# Patient Record
Sex: Male | Born: 1980 | Race: White | Hispanic: No | Marital: Married | Smoking: Former smoker
Health system: Southern US, Community
[De-identification: ages and names within clinical notes are randomized; demographics above are authoritative.]

## PROBLEM LIST (undated history)

## (undated) DIAGNOSIS — E785 Hyperlipidemia, unspecified: Secondary | ICD-10-CM

## (undated) HISTORY — PX: NASAL SEPTUM SURGERY: SHX37

## (undated) HISTORY — DX: Hyperlipidemia, unspecified: E78.5

---

## 2009-12-18 ENCOUNTER — Emergency Department (HOSPITAL_COMMUNITY): Admission: EM | Admit: 2009-12-18 | Discharge: 2009-12-18 | Payer: Self-pay | Admitting: Family Medicine

## 2010-04-02 ENCOUNTER — Emergency Department (HOSPITAL_COMMUNITY)
Admission: EM | Admit: 2010-04-02 | Discharge: 2010-04-02 | Payer: Self-pay | Source: Home / Self Care | Admitting: Family Medicine

## 2010-05-11 ENCOUNTER — Ambulatory Visit
Admission: RE | Admit: 2010-05-11 | Discharge: 2010-05-11 | Disposition: A | Discharge status: Home / Self Care | Payer: PRIVATE HEALTH INSURANCE | Source: Ambulatory Visit | Attending: Orthopedic Surgery | Admitting: Orthopedic Surgery

## 2010-05-11 ENCOUNTER — Other Ambulatory Visit: Payer: Self-pay | Admitting: Orthopedic Surgery

## 2010-05-11 DIAGNOSIS — M5430 Sciatica, unspecified side: Secondary | ICD-10-CM

## 2010-05-25 ENCOUNTER — Other Ambulatory Visit: Payer: Self-pay | Admitting: Orthopedic Surgery

## 2010-05-25 DIAGNOSIS — M545 Low back pain, unspecified: Secondary | ICD-10-CM

## 2010-06-01 ENCOUNTER — Ambulatory Visit
Admission: RE | Admit: 2010-06-01 | Discharge: 2010-06-01 | Disposition: A | Discharge status: Home / Self Care | Payer: PRIVATE HEALTH INSURANCE | Source: Ambulatory Visit | Attending: Orthopedic Surgery | Admitting: Orthopedic Surgery

## 2010-06-01 DIAGNOSIS — M545 Low back pain, unspecified: Secondary | ICD-10-CM

## 2010-06-11 LAB — GLUCOSE, CAPILLARY

## 2010-06-25 ENCOUNTER — Ambulatory Visit: Payer: PRIVATE HEALTH INSURANCE | Attending: Orthopedic Surgery | Admitting: Physical Therapy

## 2010-06-25 DIAGNOSIS — M25569 Pain in unspecified knee: Secondary | ICD-10-CM | POA: Insufficient documentation

## 2010-06-25 DIAGNOSIS — M2569 Stiffness of other specified joint, not elsewhere classified: Secondary | ICD-10-CM | POA: Insufficient documentation

## 2010-06-25 DIAGNOSIS — M545 Low back pain, unspecified: Secondary | ICD-10-CM | POA: Insufficient documentation

## 2010-06-25 DIAGNOSIS — IMO0001 Reserved for inherently not codable concepts without codable children: Secondary | ICD-10-CM | POA: Insufficient documentation

## 2010-07-04 ENCOUNTER — Ambulatory Visit: Payer: PRIVATE HEALTH INSURANCE | Attending: Orthopedic Surgery | Admitting: Physical Therapy

## 2010-07-04 DIAGNOSIS — M25569 Pain in unspecified knee: Secondary | ICD-10-CM | POA: Insufficient documentation

## 2010-07-04 DIAGNOSIS — IMO0001 Reserved for inherently not codable concepts without codable children: Secondary | ICD-10-CM | POA: Insufficient documentation

## 2010-07-04 DIAGNOSIS — M545 Low back pain, unspecified: Secondary | ICD-10-CM | POA: Insufficient documentation

## 2010-07-04 DIAGNOSIS — M2569 Stiffness of other specified joint, not elsewhere classified: Secondary | ICD-10-CM | POA: Insufficient documentation

## 2010-07-05 ENCOUNTER — Ambulatory Visit: Payer: PRIVATE HEALTH INSURANCE | Admitting: Physical Therapy

## 2010-07-11 ENCOUNTER — Encounter: Payer: PRIVATE HEALTH INSURANCE | Admitting: Physical Therapy

## 2010-07-18 ENCOUNTER — Ambulatory Visit: Payer: PRIVATE HEALTH INSURANCE | Admitting: Physical Therapy

## 2010-07-20 ENCOUNTER — Encounter: Payer: PRIVATE HEALTH INSURANCE | Admitting: Physical Therapy

## 2012-07-28 IMAGING — CR DG LUMBAR SPINE COMPLETE 4+V
5 series · 5 of 5 positions shown · non-contrast
Comparison: None.

CLINICAL DATA: 2 months persistent low back and left leg pain
without recent injury.

LUMBAR SPINE - COMPLETE 4+ VIEW

[t l-spine a.p.]
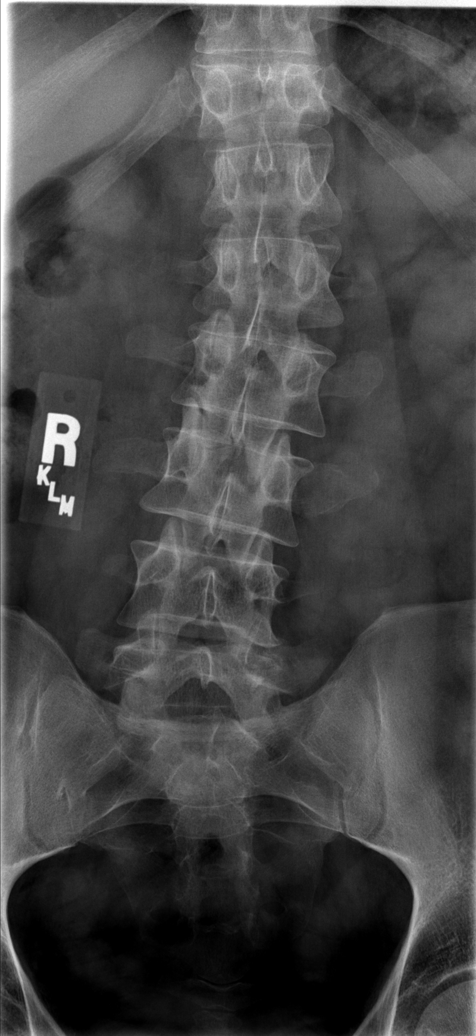

[t l-spine oblique exposure (1 of 2)]
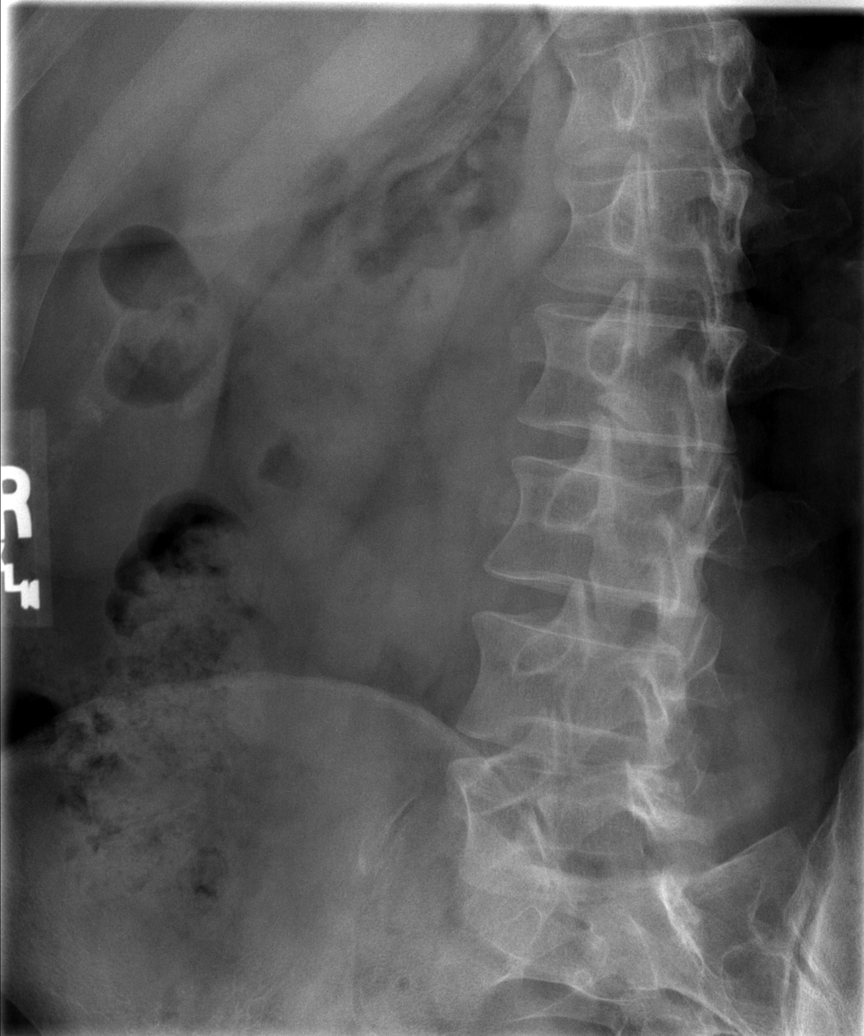

[t l-spine oblique exposure (2 of 2)]
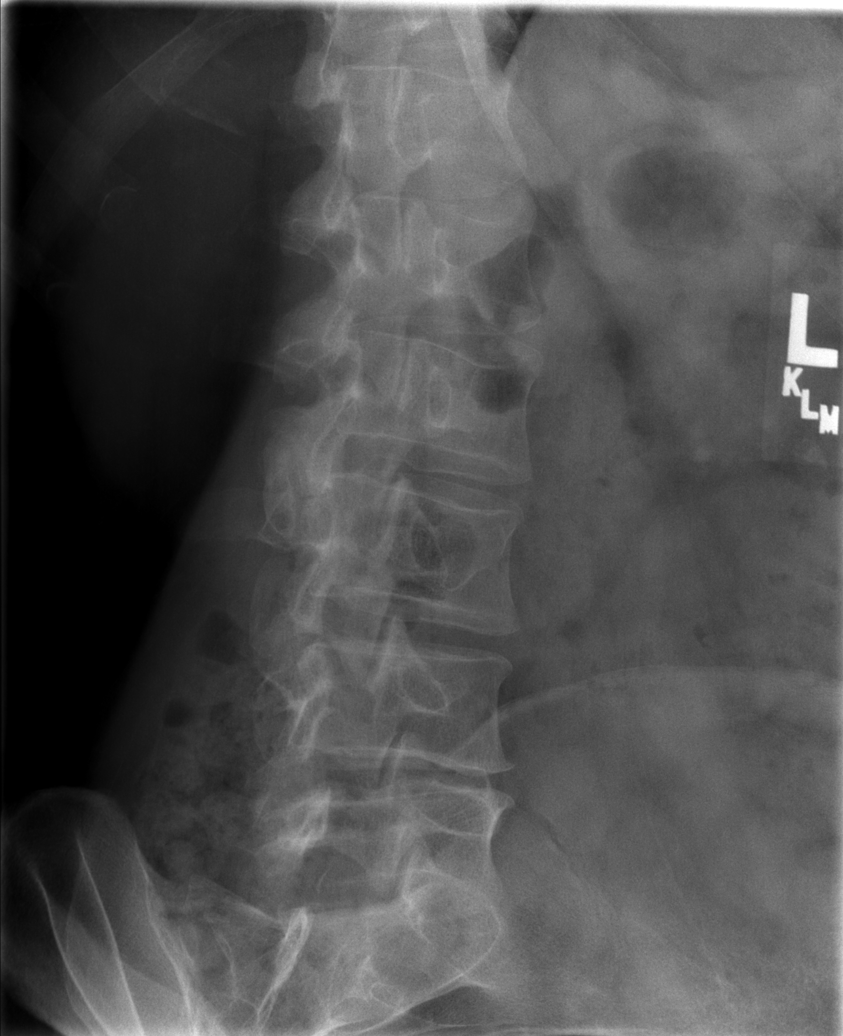

[t l-spine lat]
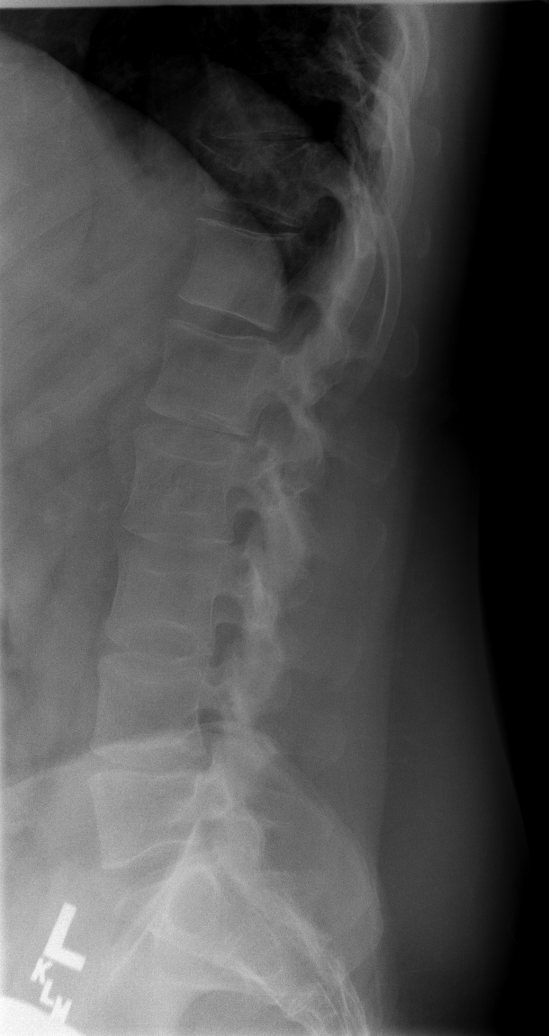

[t l-spine l5-s1 spot]
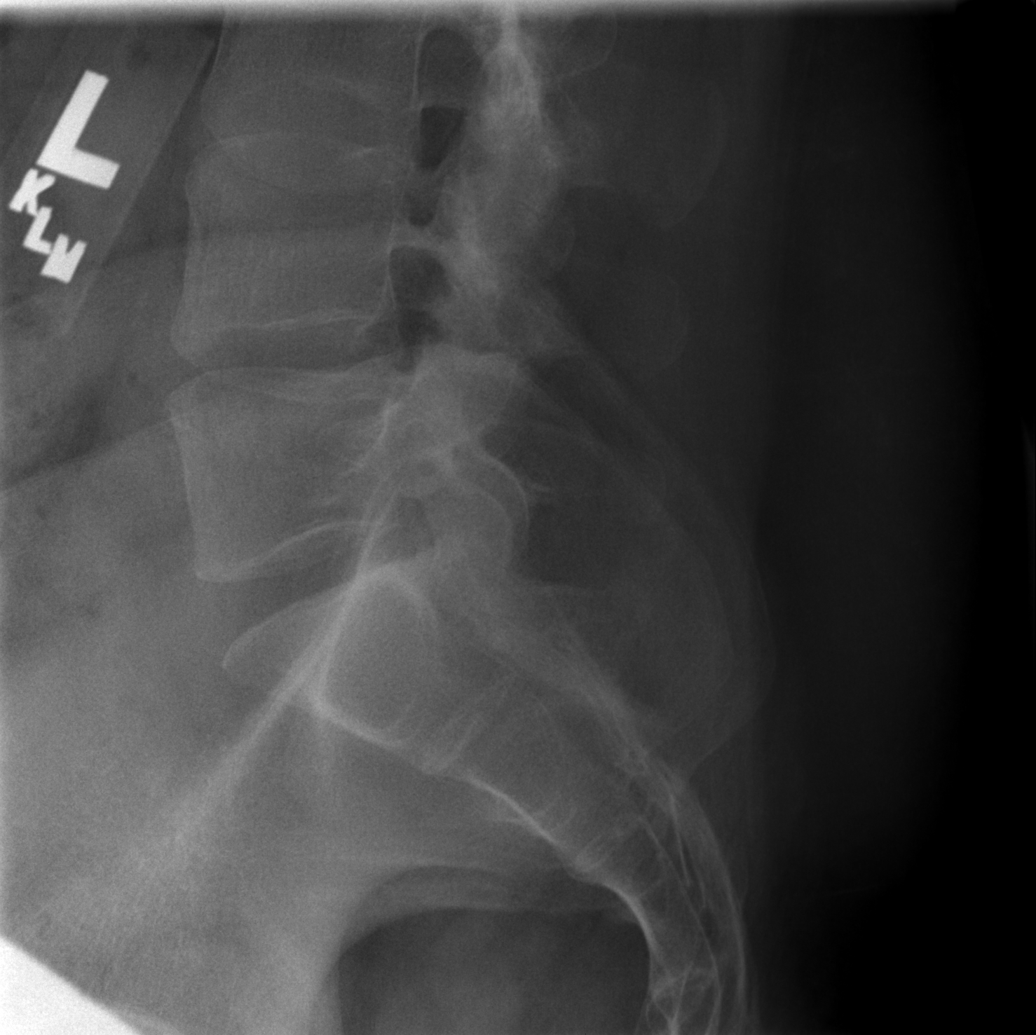

[5 of 5 positions shown; findings below may reference images not displayed]

FINDINGS: Assignment hypoplastic twelfth ribs with five non-rib
bearing lumbar vertebrae.  Slight levoscoliosis thoracolumbar spine
junction noted.  Slight to moderate superior endplate T11
compression fracture without retropulsion of indeterminate age.
Vertebrae otherwise intact.  Slight degenerative disc space
narrowing L4-5 left lateral aspect.  Remaining lumbar disc spaces
and posterior vertebral alignment normally maintained with no other
significant abnormalities seen.
IMPRESSION: 1.  Hypoplastic twelfth ribs with five non-rib bearing lumbar
vertebrae assignment.
2.  Slight levoscoliosis thoracolumbar spine junction.
3.  Slight to moderate indeterminate age T11 compression fracture.
4.  Slight degenerative disc space narrowing left L4-5 disc.
5.  No additional acute findings.

## 2015-01-27 ENCOUNTER — Other Ambulatory Visit (HOSPITAL_COMMUNITY): Payer: Self-pay | Admitting: Family Medicine

## 2015-01-27 DIAGNOSIS — J342 Deviated nasal septum: Secondary | ICD-10-CM

## 2015-01-27 DIAGNOSIS — R0683 Snoring: Secondary | ICD-10-CM

## 2015-01-27 DIAGNOSIS — J322 Chronic ethmoidal sinusitis: Secondary | ICD-10-CM

## 2015-01-27 DIAGNOSIS — J320 Chronic maxillary sinusitis: Secondary | ICD-10-CM

## 2015-01-27 DIAGNOSIS — J343 Hypertrophy of nasal turbinates: Secondary | ICD-10-CM

## 2015-02-03 ENCOUNTER — Other Ambulatory Visit: Payer: Self-pay | Admitting: Otolaryngology

## 2015-02-03 DIAGNOSIS — J320 Chronic maxillary sinusitis: Secondary | ICD-10-CM

## 2015-02-03 DIAGNOSIS — J322 Chronic ethmoidal sinusitis: Secondary | ICD-10-CM

## 2015-02-03 DIAGNOSIS — J342 Deviated nasal septum: Secondary | ICD-10-CM

## 2015-02-03 DIAGNOSIS — R0683 Snoring: Secondary | ICD-10-CM

## 2015-02-03 DIAGNOSIS — J343 Hypertrophy of nasal turbinates: Secondary | ICD-10-CM

## 2015-06-23 ENCOUNTER — Encounter: Payer: Self-pay | Admitting: Endocrinology

## 2015-06-23 ENCOUNTER — Ambulatory Visit (INDEPENDENT_AMBULATORY_CARE_PROVIDER_SITE_OTHER): Payer: BLUE CROSS/BLUE SHIELD | Admitting: Endocrinology

## 2015-06-23 VITALS — BP 130/88 | HR 81 | Temp 98.3°F | Resp 14 | Ht 75.0 in | Wt 258.4 lb

## 2015-06-23 DIAGNOSIS — E291 Testicular hypofunction: Secondary | ICD-10-CM

## 2015-06-23 NOTE — Progress Notes (Signed)
Patient ID: Randall CrockerSpencer Perez, male   DOB: 12/27/1980, 35 y.o.   MRN: [id number]          Chief complaint:   Evaluation of low testosterone  History of Present Illness  Hypogonadismwas diagnosed in 12/2014  He has had complaints ofsexual dysfunction with some difficulties with erectile dysfunction, somewhat decreased libido and premature ejaculation for the last year or so. He does not think he has any fatigue, decreased motivation, lack of energy He also has occasional sweats at night but no hot flashes The patient said that he is not having a full beard on his face and although he shaves daily he does this only during the weekdays when going to work and not on weekends He has not had any children and does not plan to have any    There is no history of the following: breast enlargement,  long term steroid use, recreational drug use, history of testicular injury mumps in childhood. No history of osteopenia or low impact fracture  Prior lab results show baselinetestosterone level of 241 in 10/16, time of collection unavailable Follow-up testosterone drawn at about noon in 2/17 was 140 No other labs have been done  He is now referred here for further evaluation    Medication List    Notice  As of 06/23/2015 11:59 PM   You have not been prescribed any medications.      Allergies: No Known Allergies  Past Medical History  Diagnosis Date  . Hyperlipidemia     LDL 131, HDL 40 done in 2/17    Past Surgical History  Procedure Laterality Date  . Nasal septum surgery      Family History  Problem Relation Age of Onset  . Hyperlipidemia Father   . Diabetes Neg Hx     Social History:  reports that he has quit smoking. He has never used smokeless tobacco. His alcohol and drug histories are not on file.  Review of Systems  Constitutional: Positive for weight gain.       Over the last year or so he has gained about 20 lbs  HENT: Positive for headaches.        He has only  infrequent headaches, not severe  Eyes: Negative for blurred vision.  Endocrine: Negative for fatigue, cold intolerance, decreased concentration and light-headedness.  Genitourinary: Negative for nocturia.  Musculoskeletal: Negative for joint pain and muscle cramps.  Skin: Negative for rash.  Neurological: Negative for weakness, numbness and tingling.  Psychiatric/Behavioral: Negative for depressed mood.      General Examination:   BP 130/88 mmHg  Pulse 81  Temp(Src) 98.3 F (36.8 C)  Resp 14  Ht 6\' 3"  (1.905 m)  Wt 258 lb 6.4 oz (117.209 kg)  BMI 32.30 kg/m2  SpO2 97%  GENERAL APPEARANCE generalized obesity present.  SKIN:normal, no rash or pigmentation.  Does not appear to have a full beard on his face.  Facial hair is scant on his trunk HEENT:Oral mucosa normal. Normal oropharyngeal opening.  EYES:normal external appearance of eyes, Fundii benign.   NECK:no lymphadenopathy, no thyromegaly.  CHEST: Gynecomastia absent LUNGS:clear to auscultation bilaterally, no wheezes, rhonchi, rales.   HEART:normal S1 And S2, no S3, S4, murmur or click.  ABDOMEN:no hepatosplenomegaly, no masses palpated, soft and not tender.   MALE GENITOURINARY:Testicles are normal in size in texture  MUSCULOSKELETALNo enlargement or deformity of joints.  EXTREMITIES:no clubbing, no edema.  NEUROLOGIC EXAM: Biceps reflexes normal (2+) bilaterally.   Assessment/ Plan:  Recently diagnosed hypogonadism  He has been symptomatic relief the last year or so with only subtle symptoms of sexual dysfunction and no unusual fatigue Appears to have relatively decreased body hair especially facial and truncal   Discussed in general different causes of hypogonadism  Discussed that we would need to evaluate his pituitary gland function and also rule out hyperprolactinemia before considering any treatment. He is not concerned about  fertility issues at this time   Discussed various options for testosterone supplementation including transdermal gel or liquid preparations,  testosterone injections and clomiphene.  Discussed pros and cons of various treatments  OBESITY: Currently does not have any evidence of abnormal glucose but does have dyslipidemia and discussed modification of saturated fat intake to help control this as well as lipid targets   Randall Perez 06/24/2015, 10:07 AM

## 2015-06-24 ENCOUNTER — Encounter: Payer: Self-pay | Admitting: Endocrinology

## 2015-06-26 ENCOUNTER — Other Ambulatory Visit (INDEPENDENT_AMBULATORY_CARE_PROVIDER_SITE_OTHER): Payer: PRIVATE HEALTH INSURANCE

## 2015-06-26 DIAGNOSIS — E291 Testicular hypofunction: Secondary | ICD-10-CM

## 2015-06-26 LAB — T4, FREE: FREE T4: 0.9 ng/dL (ref 0.60–1.60)

## 2015-06-26 LAB — LUTEINIZING HORMONE: LH: 1.23 m[IU]/mL — AB (ref 1.50–9.30)

## 2015-06-27 LAB — PROLACTIN: Prolactin: 13.2 ng/mL (ref 4.0–15.2)

## 2015-06-27 LAB — TESTOSTERONE, FREE, TOTAL, SHBG
Sex Hormone Binding: 35.8 nmol/L (ref 16.5–55.9)
Testosterone, Free: 5.7 pg/mL — ABNORMAL LOW (ref 8.7–25.1)
Testosterone: 130 ng/dL — ABNORMAL LOW (ref 348–1197)

## 2015-07-02 NOTE — Progress Notes (Signed)
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 Would like to give him a trial of clomiphene 25 mg 3 times a week Does need appointment to be seen in 6 weeks with morning testosterone level ______

## 2015-07-06 ENCOUNTER — Encounter: Payer: Self-pay | Admitting: *Deleted

## 2015-07-06 ENCOUNTER — Other Ambulatory Visit: Payer: Self-pay | Admitting: *Deleted

## 2015-07-06 MED ORDER — CLOMIPHENE CITRATE 50 MG PO TABS: ORAL_TABLET | ORAL | Status: AC

## 2021-04-07 ENCOUNTER — Emergency Department (HOSPITAL_COMMUNITY)
Admission: EM | Admit: 2021-04-07 | Discharge: 2021-04-07 | Disposition: A | Discharge status: Home / Self Care | Payer: BC Managed Care – PPO | Attending: Emergency Medicine | Admitting: Emergency Medicine

## 2021-04-07 ENCOUNTER — Emergency Department (HOSPITAL_COMMUNITY): Payer: BC Managed Care – PPO

## 2021-04-07 ENCOUNTER — Encounter (HOSPITAL_COMMUNITY): Payer: Self-pay | Admitting: Emergency Medicine

## 2021-04-07 DIAGNOSIS — Z79899 Other long term (current) drug therapy: Secondary | ICD-10-CM | POA: Insufficient documentation

## 2021-04-07 DIAGNOSIS — S3992XA Unspecified injury of lower back, initial encounter: Secondary | ICD-10-CM | POA: Diagnosis present

## 2021-04-07 DIAGNOSIS — S32020A Wedge compression fracture of second lumbar vertebra, initial encounter for closed fracture: Secondary | ICD-10-CM | POA: Diagnosis not present

## 2021-04-07 LAB — CBC WITH DIFFERENTIAL/PLATELET
Abs Immature Granulocytes: 0.11 10*3/uL — ABNORMAL HIGH (ref 0.00–0.07)
Basophils Absolute: 0 10*3/uL (ref 0.0–0.1)
Basophils Relative: 0 %
Eosinophils Absolute: 0 10*3/uL (ref 0.0–0.5)
Eosinophils Relative: 0 %
HCT: 47.7 % (ref 39.0–52.0)
Hemoglobin: 15.8 g/dL (ref 13.0–17.0)
Immature Granulocytes: 1 %
Lymphocytes Relative: 8 %
Lymphs Abs: 1.2 10*3/uL (ref 0.7–4.0)
MCH: 31 pg (ref 26.0–34.0)
MCHC: 33.1 g/dL (ref 30.0–36.0)
MCV: 93.7 fL (ref 80.0–100.0)
Monocytes Absolute: 0.5 10*3/uL (ref 0.1–1.0)
Monocytes Relative: 4 %
Neutro Abs: 12.4 10*3/uL — ABNORMAL HIGH (ref 1.7–7.7)
Neutrophils Relative %: 87 %
Platelets: 323 10*3/uL (ref 150–400)
RBC: 5.09 MIL/uL (ref 4.22–5.81)
RDW: 12.3 % (ref 11.5–15.5)
WBC: 14.2 10*3/uL — ABNORMAL HIGH (ref 4.0–10.5)
nRBC: 0 % (ref 0.0–0.2)

## 2021-04-07 LAB — BASIC METABOLIC PANEL
Anion gap: 8 (ref 5–15)
BUN: 15 mg/dL (ref 6–20)
CO2: 27 mmol/L (ref 22–32)
Calcium: 9.3 mg/dL (ref 8.9–10.3)
Chloride: 103 mmol/L (ref 98–111)
Creatinine, Ser: 0.94 mg/dL (ref 0.61–1.24)
GFR, Estimated: >60 mL/min (ref >60)
Glucose, Bld: 104 mg/dL — ABNORMAL HIGH (ref 70–99)
Potassium: 4.1 mmol/L (ref 3.5–5.1)
Sodium: 138 mmol/L (ref 135–145)

## 2021-04-07 MED ORDER — HYDROMORPHONE HCL 1 MG/ML IJ SOLN
1.0000 mg | Freq: Once | INTRAMUSCULAR | Status: AC
  Administered 2021-04-07: 1 mg via INTRAMUSCULAR
  Filled 2021-04-07: qty 1

## 2021-04-07 MED ORDER — OXYCODONE-ACETAMINOPHEN 5-325 MG PO TABS: 1.0000 | ORAL_TABLET | Freq: Three times a day (TID) | ORAL | 0 refills | Status: AC | PRN

## 2021-04-07 MED ORDER — OXYCODONE-ACETAMINOPHEN 5-325 MG PO TABS
1.0000 | ORAL_TABLET | Freq: Once | ORAL | Status: AC
  Administered 2021-04-07: 1 via ORAL
  Filled 2021-04-07: qty 1

## 2021-04-07 MED ORDER — HYDROMORPHONE HCL 1 MG/ML IJ SOLN
0.5000 mg | Freq: Once | INTRAMUSCULAR | Status: AC
  Administered 2021-04-07: 0.5 mg via INTRAVENOUS
  Filled 2021-04-07: qty 1

## 2021-04-07 MED ORDER — IOPAMIDOL (ISOVUE-300) INJECTION 61%
100.0000 mL | Freq: Once | INTRAVENOUS | Status: AC | PRN
  Administered 2021-04-07: 100 mL via INTRAVENOUS
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
## 2021-04-07 NOTE — ED Notes (Signed)
Pt updated on CT status and wait

## 2021-04-07 NOTE — ED Notes (Signed)
Randall Perez at bedside for application of lumbar brace

## 2021-04-07 NOTE — Progress Notes (Signed)
Orthopedic Tech Progress Note Patient Details:  Randall Perez [birth year] [id number]  Ortho Devices Type of Ortho Device: Lumbar corsett Ortho Device/Splint Interventions: Ordered, Application, Adjustment   Post Interventions Patient Tolerated: Well Instructions Provided: Care of device, Adjustment of device  Karolee Stamps 04/07/2021, 8:44 PM

## 2021-04-07 NOTE — Discharge Instructions (Signed)
You have an L2 compression fracture of placed you in a brace please wear at all times except while sleeping and or taking a shower.  Recommend over-the-counter pain medication as needed for pain management.  I have given you a short course of narcotics please take as prescribed.  This medication can make you drowsy do not consume alcohol or operate heavy machinery when taking this medication.  This medication is Tylenol in it do not take Tylenol and take this medication.    Please follow-up with neurosurgery for further evaluation in 2 to 3 weeks time.  Come back to the emergency department if you develop chest pain, shortness of breath, severe abdominal pain, uncontrolled nausea, vomiting, diarrhea.

## 2021-04-07 NOTE — ED Notes (Signed)
Patient transported to CT 
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
## 2021-04-07 NOTE — ED Notes (Signed)
Provider updated to pt wherabouts and status
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
## 2021-04-07 NOTE — ED Notes (Signed)
Return from CT
